# Patient Record
Sex: Female | Born: 1995 | Marital: Married | Smoking: Never smoker
Health system: Southern US, Community
[De-identification: ages and names within clinical notes are randomized; demographics above are authoritative.]

## PROBLEM LIST (undated history)

## (undated) DIAGNOSIS — O039 Complete or unspecified spontaneous abortion without complication: Secondary | ICD-10-CM

## (undated) HISTORY — DX: Complete or unspecified spontaneous abortion without complication: O03.9

---

## 2021-04-04 ENCOUNTER — Ambulatory Visit (INDEPENDENT_AMBULATORY_CARE_PROVIDER_SITE_OTHER): Payer: BLUE CROSS/BLUE SHIELD | Admitting: Certified Nurse Midwife

## 2021-04-04 ENCOUNTER — Encounter: Payer: Self-pay | Admitting: Certified Nurse Midwife

## 2021-04-04 ENCOUNTER — Other Ambulatory Visit: Payer: Self-pay

## 2021-04-04 ENCOUNTER — Ambulatory Visit: Payer: BLUE CROSS/BLUE SHIELD

## 2021-04-04 VITALS — BP 108/72 | HR 76 | Resp 16 | Ht 60.0 in | Wt 184.7 lb

## 2021-04-04 DIAGNOSIS — O039 Complete or unspecified spontaneous abortion without complication: Secondary | ICD-10-CM

## 2021-04-04 NOTE — Progress Notes (Signed)
Subjective:    Crystal Stanton is a 26 y.o. female. Crystal Stanton reports bleeding. She was seen at [facility] and diagnosis with miscarriage 03/22/21, 03/30/21.  She is in acute distress. Ectopic risks: none. She notes that she had heavy bleeding with passing of clots and tissue on the first day. She continues to have moderate bleeding. She states the [facility] told her that she u/s showed that she still had products of conception present.   The following portions of the patient's history were reviewed and updated as appropriate: allergies, current medications, past family history, past medical history, past social history, past surgical history and problem list.  Review of Systems Pertinent items are noted in HPI.   Objective:     BP 108/72   Pulse 76   Resp 16   Ht 5' (1.524 m)   Wt 184 lb 11.2 oz (83.8 kg)   BMI 36.07 kg/m  General:   alert, cooperative, appears stated age and mild distress  Heart: regular rate and rhythm  Abdomen: soft, non-tender, without masses or organomegaly   Imaging Limited office ultrasound: repeat u/s ordered   ED note [facility] 03/30/21 Ultrasound today shows IUP at 6 weeks 2 days with no fetal heart tones. Ultrasound 8 days ago showed IUP at 6 weeks 0 days without fetal heart tones. Ultrasound today also shows possible small subchorionic hemorrhage.   Previous beta quant levels  03/22/21 9478.2 3/31/2207755.0 04/01/21  2269.8     Assessment:      Bleeding in early pregnancy   Plan:    Blood type and Rh: positive.  Discussed repeat u/s to evaluate for retained products. Use of medication if products still present. Repeat beta quant level today and continued monitoring until returns to non pregnant level. She verbalizes and agrees to plan of care. Will follow up with results    Face to face time 15 min. Reviewing history , developing plan of care.   Doreene Burke, CNM

## 2021-04-05 ENCOUNTER — Telehealth: Payer: Self-pay | Admitting: Certified Nurse Midwife

## 2021-04-05 ENCOUNTER — Other Ambulatory Visit: Payer: Self-pay | Admitting: Certified Nurse Midwife

## 2021-04-05 ENCOUNTER — Other Ambulatory Visit: Payer: Self-pay

## 2021-04-05 ENCOUNTER — Ambulatory Visit
Admission: RE | Admit: 2021-04-05 | Discharge: 2021-04-05 | Disposition: A | Discharge status: Home / Self Care | Payer: BLUE CROSS/BLUE SHIELD | Source: Ambulatory Visit | Attending: Certified Nurse Midwife | Admitting: Certified Nurse Midwife

## 2021-04-05 DIAGNOSIS — O039 Complete or unspecified spontaneous abortion without complication: Secondary | ICD-10-CM | POA: Insufficient documentation

## 2021-04-05 LAB — BETA HCG QUANT (REF LAB): hCG Quant: 873 m[IU]/mL

## 2021-04-05 MED ORDER — MISOPROSTOL 200 MCG PO TABS: 800.0000 ug | ORAL_TABLET | Freq: Once | ORAL | 1 refills | Status: DC

## 2021-04-05 MED ORDER — OXYCODONE-ACETAMINOPHEN 7.5-325 MG PO TABS: 1.0000 | ORAL_TABLET | Freq: Four times a day (QID) | ORAL | 0 refills | Status: DC | PRN

## 2021-04-05 NOTE — Telephone Encounter (Signed)
Patient called in requesting lab results.   Please advise

## 2021-04-05 NOTE — Telephone Encounter (Signed)
Called and spoke with pt regarding u/s results. Discussed retained products and use of medication or surgery for removal. She request use of medication. Discussed cytotec , dose , route , and instructions for use. May repeat dose 48 hrs if tissue not passed. Red flag symptoms.  Pt will return to the office on Friday for repeat beta quant level.   Crystal Stanton, CNM   FACTS YOU SHOULD KNOW  About Early Pregnancy Loss  WHAT IS AN EARLY PREGNANCY LOSS? Once the egg is fertilized with the sperm and begins to develop, it attaches to the lining of the uterus. This early pregnancy tissue may not develop into an embryo (the beginning stage of a baby). Sometimes an embryo does develop but does not continue to grow. These problems can be seen on ultrasound.   MANAGEMNT OF EARLY PREGNANCY LOSS: About 4 out of 100 (0.25%) women will have a pregnancy loss in her lifetime.  One in five pregnancies is found to be an early pregnancy loss.  There are 3 ways to care for an early pregnancy loss:   (1) Surgery, (2) Medicine, (3) Waiting for you to pass the pregnancy on your own. The decision as to how to proceed after being diagnosed with and early pregnancy loss is an individual one.  The decision can be made only after appropriate counseling.  You need to weigh the pros and cons of the 3 choices. Then you can make the choice that works for you.  SURGERY (D&E) . Procedure over in 1 day . Requires being put to sleep . Bleeding may be light . Possible problems during surgery, including injury to womb(uterus) . Care provider has more control Medicine (CYTOTEC) . The complete procedure may take days to weeks . No Surgery . Bleeding may be heavy at times . There may be drug side effects . Patient has more control Waiting . You may choose to wait, in which case your own body may complete the passing of the abnormal early pregnancy on its own in about 2-4 weeks . Your bleeding may be heavy at times . There is a  small possibility that you may need surgery if the bleeding is too much or not all of the pregnancy has passed.  CYTOTEC MANAGEMENT Prostaglandins (cytotec) are the most widely used drug for this purpose. They cause the uterus to cramp and contract. You will place the medicine yourself inside your vagina in the privacy of your home. Empting of the uterus should occur within 3 days but the process may continue for several weeks. The bleeding may seem heavy at times.  INSTRUCTIONS: Take all 4 tablets of cytotec ( total) at one time. This will cause a lot of cramping, you may have bleeding, and pass tissue, then the cramping and bleeding should get better. If you do not pass the tissue, then you can take 4 more tablets of cytotec ( total) 48 hours after your first dose.  You will come back to have your blood drawn to make sure the pregnancy hormones are dropping in 1 week. Please call us if you have any questions.   POSSIBLE SIDE EFFECTS FROM CYTOTEC . Nausea  Vomiting . Diarrhea Fever . Chills  Hot Flashes Side effects  from the process of the early pregnancy loss include: . Cramping  Bleeding . Headaches  Dizziness RISKS: This is a low risk procedure. Less than 1 in 100 women has a complication. An incomplete passage of the early pregnancy may occur. Also, hemorrhage (  heavy bleeding) could happen.  Rarely the pregnancy will not be passed completely. Excessively heavy bleeding may occur.  Your doctor may need to perform surgery to empty the uterus (D&E). Afterwards: Everybody will feel differently after the early pregnancy loss completion. You may have soreness or cramps for a day or two. You may have soreness or cramps for day or two.  You may have light bleeding for up to 2 weeks. You may be as active as you feel like being. If you have any of the following problems you may call [facility] at [phone] or Maternity Admissions Unit at [phone] if it is after hours. . If you have  pain that does not get better with pain medication . Bleeding that soaks through 2 thick full-sized sanitary pads in an hour . Cramps that last longer than 2 days . Foul smelling discharge . Fever above 100.4 degrees F Even if you do not have any of these symptoms, you should have a follow-up exam to make sure you are healing properly. Your next normal period will usually start again in 4-6 week after the loss. You can get pregnant soon after the loss, so use birth control right away. Finally: Make sure all your questions are answered before during and after any procedure. Follow up with medical care and family planning methods.

## 2021-04-07 ENCOUNTER — Other Ambulatory Visit: Payer: Self-pay

## 2021-04-07 ENCOUNTER — Other Ambulatory Visit: Payer: BLUE CROSS/BLUE SHIELD

## 2021-04-07 DIAGNOSIS — O039 Complete or unspecified spontaneous abortion without complication: Secondary | ICD-10-CM

## 2021-04-08 ENCOUNTER — Other Ambulatory Visit: Payer: Self-pay | Admitting: Certified Nurse Midwife

## 2021-04-08 DIAGNOSIS — O039 Complete or unspecified spontaneous abortion without complication: Secondary | ICD-10-CM

## 2021-04-08 LAB — BETA HCG QUANT (REF LAB): hCG Quant: 270 m[IU]/mL

## 2021-04-12 ENCOUNTER — Encounter: Payer: Self-pay | Admitting: Certified Nurse Midwife

## 2021-04-13 ENCOUNTER — Other Ambulatory Visit: Payer: Self-pay

## 2021-04-13 ENCOUNTER — Other Ambulatory Visit: Payer: Self-pay | Admitting: Certified Nurse Midwife

## 2021-04-13 ENCOUNTER — Other Ambulatory Visit: Payer: BLUE CROSS/BLUE SHIELD

## 2021-04-13 DIAGNOSIS — O039 Complete or unspecified spontaneous abortion without complication: Secondary | ICD-10-CM

## 2021-04-14 LAB — BETA HCG QUANT (REF LAB): hCG Quant: 28 m[IU]/mL

## 2021-04-16 ENCOUNTER — Other Ambulatory Visit: Payer: Self-pay | Admitting: Certified Nurse Midwife

## 2021-04-16 DIAGNOSIS — O039 Complete or unspecified spontaneous abortion without complication: Secondary | ICD-10-CM

## 2021-04-19 ENCOUNTER — Other Ambulatory Visit: Payer: Self-pay

## 2021-04-19 ENCOUNTER — Other Ambulatory Visit: Payer: BLUE CROSS/BLUE SHIELD

## 2021-04-19 DIAGNOSIS — O039 Complete or unspecified spontaneous abortion without complication: Secondary | ICD-10-CM

## 2021-04-20 LAB — BETA HCG QUANT (REF LAB): hCG Quant: 6 m[IU]/mL

## 2021-04-21 ENCOUNTER — Other Ambulatory Visit: Payer: Self-pay | Admitting: Certified Nurse Midwife

## 2021-04-21 DIAGNOSIS — O039 Complete or unspecified spontaneous abortion without complication: Secondary | ICD-10-CM

## 2021-04-25 ENCOUNTER — Other Ambulatory Visit: Payer: BLUE CROSS/BLUE SHIELD

## 2021-04-25 ENCOUNTER — Other Ambulatory Visit: Payer: Self-pay

## 2021-04-25 DIAGNOSIS — O039 Complete or unspecified spontaneous abortion without complication: Secondary | ICD-10-CM

## 2021-04-26 LAB — BETA HCG QUANT (REF LAB): hCG Quant: 2 m[IU]/mL

## 2021-05-26 ENCOUNTER — Encounter: Payer: Self-pay | Admitting: Certified Nurse Midwife

## 2021-05-26 ENCOUNTER — Other Ambulatory Visit (HOSPITAL_COMMUNITY)
Admission: RE | Admit: 2021-05-26 | Discharge: 2021-05-26 | Disposition: A | Discharge status: Home / Self Care | Payer: BLUE CROSS/BLUE SHIELD | Source: Ambulatory Visit | Attending: Certified Nurse Midwife | Admitting: Certified Nurse Midwife

## 2021-05-26 ENCOUNTER — Ambulatory Visit: Payer: BLUE CROSS/BLUE SHIELD | Admitting: Certified Nurse Midwife

## 2021-05-26 ENCOUNTER — Other Ambulatory Visit: Payer: Self-pay

## 2021-05-26 VITALS — BP 113/78 | HR 66 | Resp 16 | Ht 60.0 in | Wt 182.3 lb

## 2021-05-26 DIAGNOSIS — Z124 Encounter for screening for malignant neoplasm of cervix: Secondary | ICD-10-CM | POA: Insufficient documentation

## 2021-05-26 DIAGNOSIS — Z8759 Personal history of other complications of pregnancy, childbirth and the puerperium: Secondary | ICD-10-CM

## 2021-05-26 DIAGNOSIS — N924 Excessive bleeding in the premenopausal period: Secondary | ICD-10-CM | POA: Insufficient documentation

## 2021-05-26 DIAGNOSIS — Z8742 Personal history of other diseases of the female genital tract: Secondary | ICD-10-CM

## 2021-05-26 DIAGNOSIS — N923 Ovulation bleeding: Secondary | ICD-10-CM | POA: Diagnosis present

## 2021-05-26 DIAGNOSIS — N930 Postcoital and contact bleeding: Secondary | ICD-10-CM | POA: Diagnosis present

## 2021-05-26 MED ORDER — METFORMIN HCL 500 MG PO TABS: ORAL_TABLET | ORAL | 5 refills | Status: AC

## 2021-05-26 NOTE — Progress Notes (Signed)
GYN ENCOUNTER NOTE  Subjective:       Crystal Stanton is a 26 y.o. G1P0 female is here for gynecologic evaluation of the following issues:  1. Daily spotting since miscarriage 2. Bleeding after sex  History of [place] (03/[place]21[place].   Desires pregnancy, history significant for PCOS.   Denies difficulty breathing or respiratory distress, chest pain, excessive vaginal bleeding, dysuria, and leg pain or swelling.    Gynecologic History  Patient's last menstrual period was 05/09/2021.  Contraception: none  Last Pap: 2020. Results were: normal per patient  Obstetric History  OB History  Gravida Para Term Preterm AB Living  1       1    SAB IAB Ectopic Multiple Live Births  1            # Outcome Date GA Lbr Len/2nd Weight Sex Delivery Anes PTL Lv  1 SAB 03/2021            History reviewed. No pertinent past medical history.  History reviewed. No pertinent surgical history.   Not on File  Social History   Socioeconomic History  . Marital status: Married    Spouse name: Not on file  . Number of children: Not on file  . Years of education: Not on file  . Highest education level: Not on file  Occupational History  . Not on file  Tobacco Use  . Smoking status: Never Smoker  . Smokeless tobacco: Never Used  Substance and Sexual Activity  . Alcohol use: Never  . Drug use: Never  . Sexual activity: Yes    Partners: Male  Other Topics Concern  . Not on file  Social History Narrative  . Not on file   Social Determinants of Health   Financial Resource Strain: Not on file  Food Insecurity: Not on file  Transportation Needs: Not on file  Physical Activity: Not on file  Stress: Not on file  Social Connections: Not on file  Intimate Partner Violence: Not on file    Family History  Problem Relation Age of Onset  . Diabetes Mother   . Diabetes Maternal Grandmother   . Gastric cancer Maternal Grandfather     The following portions of the  patient's history were reviewed and updated as appropriate: allergies, current medications, past family history, past medical history, past social history, past surgical history and problem list.  Review of Systems  ROS negative except as noted above. Information obtained from patient.   Objective:   BP 113/78   Pulse 66   Resp 16   Ht 5' (1.524 m)   Wt 182 lb 4.8 oz (82.7 kg)   LMP 05/09/2021   BMI 35.60 kg/m  CONSTITUTIONAL: Well-developed, well-nourished female in no acute distress.   PELVIC:  External Genitalia: Normal  Vagina: Normal, scant bleeding present  Cervix: Normal, Pap collected   MUSCULOSKELETAL: Normal range of motion. No tenderness.  No cyanosis, clubbing, or edema.  Assessment:   1. Intermenstrual spotting  - Progesterone - Estradiol - FSH/LH - TSH + free T4 - Cytology - PAP  2. History of miscarriage  - Progesterone - Estradiol - FSH/LH - TSH + free T4  3. Premenopausal postcoital bleeding  - Progesterone - Estradiol - FSH/LH - TSH + free T4 - Cytology - PAP  4. Screening for cervical cancer  - Cytology - PAP  5. History of PCOS      Plan:   Labs today, see orders.   Rx Metformin,  see orders.   Will discuss next step via MyChart.   Reviewed red flag symptoms and when to call.   RTC as indicated.    Serafina Royals, CNM [facility], [facility]

## 2021-05-26 NOTE — Patient Instructions (Signed)
Metformin Tablets What is this medicine? METFORMIN (met FOR min) is used to treat type 2 diabetes. It helps to control blood sugar. Treatment is combined with diet and exercise. This medicine can be used alone or with other medicines for diabetes. This medicine may be used for other purposes; ask your health care provider or pharmacist if you have questions. COMMON BRAND NAME(S): Glucophage What should I tell my health care provider before I take this medicine? They need to know if you have any of these conditions:  anemia  dehydration  heart disease  frequently drink alcohol-containing beverages  kidney disease  liver disease  polycystic ovary syndrome  serious infection or injury  vomiting  an unusual or allergic reaction to metformin, other medicines, foods, dyes, or preservatives  pregnant or trying to get pregnant  breast-feeding How should I use this medicine? Take this medicine by mouth with a glass of water. Follow the directions on the prescription label. Take this medicine with food. Take your medicine at regular intervals. Do not take your medicine more often than directed. Do not stop taking except on your doctor's advice. Talk to your pediatrician regarding the use of this medicine in children. While this drug may be prescribed for children as young as 17 years of age for selected conditions, precautions do apply. Overdosage: If you think you have taken too much of this medicine contact a poison control center or emergency room at once. NOTE: This medicine is only for you. Do not share this medicine with others. What if I miss a dose? If you miss a dose, take it as soon as you can. If it is almost time for your next dose, take only that dose. Do not take double or extra doses. What may interact with this medicine? Do not take this medicine with any of the following medications:  certain contrast medicines given before X-rays, CT scans, MRI, or other  procedures  dofetilide This medicine may also interact with the following medications:  acetazolamide  alcohol  certain antivirals for HIV or hepatitis  certain medicines for blood pressure, heart disease, irregular heart beat  cimetidine  dichlorphenamide  digoxin  diuretics  female hormones, like estrogens or progestins and birth control pills  glycopyrrolate  isoniazid  lamotrigine  memantine  methazolamide  metoclopramide  midodrine  niacin  phenothiazines like chlorpromazine, mesoridazine, prochlorperazine, thioridazine  phenytoin  ranolazine  steroid medicines like prednisone or cortisone  stimulant medicines for attention disorders, weight loss, or to stay awake  thyroid medicines  topiramate  trospium  vandetanib  zonisamide This list may not describe all possible interactions. Give your health care provider a list of all the medicines, herbs, non-prescription drugs, or dietary supplements you use. Also tell them if you smoke, drink alcohol, or use illegal drugs. Some items may interact with your medicine. What should I watch for while using this medicine? Visit your doctor or health care professional for regular checks on your progress. A test called the HbA1C (A1C) will be monitored. This is a simple blood test. It measures your blood sugar control over the last 2 to 3 months. You will receive this test every 3 to 6 months. Learn how to check your blood sugar. Learn the symptoms of low and high blood sugar and how to manage them. Always carry a quick-source of sugar with you in case you have symptoms of low blood sugar. Examples include hard sugar candy or glucose tablets. Make sure others know that you can choke  if you eat or drink when you develop serious symptoms of low blood sugar, such as seizures or unconsciousness. They must get medical help at once. Tell your doctor or health care professional if you have high blood sugar. You might  need to change the dose of your medicine. If you are sick or exercising more than usual, you might need to change the dose of your medicine. Do not skip meals. Ask your doctor or health care professional if you should avoid alcohol. Many nonprescription cough and cold products contain sugar or alcohol. These can affect blood sugar. This medicine may cause ovulation in premenopausal women who do not have regular monthly periods. This may increase your chances of becoming pregnant. You should not take this medicine if you become pregnant or think you may be pregnant. Talk with your doctor or health care professional about your birth control options while taking this medicine. Contact your doctor or health care professional right away if you think you are pregnant. If you are going to need surgery, a MRI, CT scan, or other procedure, tell your doctor that you are taking this medicine. You may need to stop taking this medicine before the procedure. Wear a medical ID bracelet or chain, and carry a card that describes your disease and details of your medicine and dosage times. This medicine may cause a decrease in folic acid and vitamin B12. You should make sure that you get enough vitamins while you are taking this medicine. Discuss the foods you eat and the vitamins you take with your health care professional. What side effects may I notice from receiving this medicine? Side effects that you should report to your doctor or health care professional as soon as possible:  allergic reactions like skin rash, itching or hives, swelling of the face, lips, or tongue  breathing problems  feeling faint or lightheaded, falls  muscle aches or pains  signs and symptoms of low blood sugar such as feeling anxious, confusion, dizziness, increased hunger, unusually weak or tired, sweating, shakiness, cold, irritable, headache, blurred vision, fast heartbeat, loss of consciousness  slow or irregular  heartbeat  unusual stomach pain or discomfort  unusually tired or weak Side effects that usually do not require medical attention (report to your doctor or health care professional if they continue or are bothersome):  diarrhea  headache  heartburn  metallic taste in mouth  nausea  stomach gas, upset This list may not describe all possible side effects. Call your doctor for medical advice about side effects. You may report side effects to FDA at 1-800-FDA-1088. Where should I keep my medicine? Keep out of the reach of children. Store at room temperature between 15 and 30 degrees C (59 and 86 degrees F). Protect from moisture and light. Throw away any unused medicine after the expiration date. NOTE: This sheet is a summary. It may not cover all possible information. If you have questions about this medicine, talk to your doctor, pharmacist, or health care provider.  2021 Elsevier/Gold Standard (2020-11-13 10:29:07)   Polycystic Ovary Syndrome  Polycystic ovarian syndrome (PCOS) is a common hormonal disorder among women of reproductive age. In most women with PCOS, small fluid-filled sacs (cysts) grow on the ovaries. PCOS can cause problems with menstrual periods and make it hard to get and stay pregnant. If this condition is not treated, it can lead to serious health problems, such as diabetes and heart disease. What are the causes? The cause of this condition is not known.  It may be due to certain factors, such as:  Irregular menstrual cycle.  High levels of certain hormones.  Problems with the hormone that helps to control blood sugar (insulin).  Certain genes. What increases the risk? You are more likely to develop this condition if you:  Have a family history of PCOS or type 2 diabetes.  Are overweight, eat unhealthy foods, and are not active. These factors may cause problems with blood sugar control, which can contribute to PCOS or PCOS symptoms. What are the signs  or symptoms? Symptoms of this condition include:  Ovarian cysts and sometimes pelvic pain.  Menstrual periods that are not regular or are too heavy.  Inability to get or stay pregnant.  Increased growth of hair on the face, chest, stomach, back, thumbs, thighs, or toes.  Acne or oily skin. Acne may develop during adulthood, and it may not get better with treatment.  Weight gain or obesity.  Patches of thickened and dark brown or black skin on the neck, arms, breasts, or thighs. How is this diagnosed? This condition is diagnosed based on:  Your medical history.  A physical exam that includes a pelvic exam. Your health care provider may look for areas of increased hair growth on your skin.  Tests, such as: ? An ultrasound to check the ovaries for cysts and to view the lining of the uterus. ? Blood tests to check levels of sugar (glucose), female hormone (testosterone), and female hormones (estrogen and progesterone). How is this treated? There is no cure for this condition, but treatment can help to manage symptoms and prevent more health problems from developing. Treatment varies depending on your symptoms and if you want to have a baby or if you need birth control. Treatment may include:  Making nutrition and lifestyle changes.  Taking the progesterone hormone to start a menstrual period.  Taking birth control pills to help you have regular menstrual periods.  Taking medicines such as: ? Medicines to make you ovulate, if you want to get pregnant. ? Medicine to reduce extra hair growth.  Having surgery in severe cases. This may involve making small holes in one or both of your ovaries. This decreases the amount of testosterone that your body makes. Follow these instructions at home:  Take over-the-counter and prescription medicines only as told by your health care provider.  Follow a healthy meal plan that includes lean proteins, complex carbohydrates, fresh fruits and  vegetables, low-fat dairy products, healthy fats, and fiber.  If you are overweight, lose weight as told by your health care provider. Your health care provider can determine how much weight loss is best for you and can help you lose weight safely.  Keep all follow-up visits. This is important. Contact a health care provider if:  Your symptoms do not get better with medicine.  Your symptoms get worse or you develop new symptoms. Summary  Polycystic ovarian syndrome (PCOS) is a common hormonal disorder among women of reproductive age.  PCOS can cause problems with menstrual periods and make it hard to get and stay pregnant.  If this condition is not treated, it can lead to serious health problems, such as diabetes and heart disease.  There is no cure for this condition, but treatment can help to manage symptoms and prevent more health problems from developing. This information is not intended to replace advice given to you by your health care provider. Make sure you discuss any questions you have with your health care provider. Document  Revised: 05/26/2020 Document Reviewed: 05/26/2020 Elsevier Patient Education  [place].   Diet for Polycystic Ovary Syndrome Polycystic ovary syndrome (PCOS) is a common hormonal disorder that affects a woman's reproductive system. It can cause problems with menstrual periods and make it hard to get and stay pregnant. Changing what you eat can help your hormones reach normal levels, improve your health, and help you better manage PCOS. Following a balanced diet can help you lose weight and improve the way that your body uses the hormone insulin to control blood sugar. This may include:  Eating low-fat (lean) proteins, complex carbohydrates, fresh fruits and vegetables, low-fat dairy products, healthy fats, and fiber.  Cutting down on calories.  Exercising regularly. What are tips for following this plan?  Follow a balanced diet for meals  and snacks. Eat breakfast, lunch, dinner, and one or two snacks every day.  Include protein in each meal and snack.  Choose whole grains instead of products that are made with refined flour.  Eat a variety of foods.  Exercise regularly as told by your health care provider. Aim to do at least 30 minutes of exercise on most days of the week.  If you are overweight or obese: ? Pay attention to how many calories you eat. Cutting down on calories can help you lose weight. ? Work with your health care provider or a dietitian to figure out how many calories you need each day. What foods should I eat? Fruits Include a variety of colors and types. All fruits are helpful for PCOS. Vegetables Include a variety of colors and types. All vegetables are helpful for PCOS. Grains Whole grains, such as whole wheat. Whole-grain breads, crackers, cereals, and pasta. Unsweetened oatmeal. Bulgur, barley, quinoa, and brown rice. Tortillas made from corn or whole-wheat flour. Meats and other proteins Lean proteins, such as fish, chicken, beans, eggs, and tofu. Dairy Low-fat dairy products, such as skim milk, cheese sticks, and yogurt. Beverages Low-fat or fat-free drinks, such as water, low-fat milk, sugar-free drinks, and small amounts of 100% fruit juice. Seasonings and condiments Ketchup. Mustard. Barbecue sauce. Relish. Low-fat or fat-free mayonnaise. Fats and oils Olive oil or canola oil. Walnuts and almonds. The items listed above may not be a complete list of recommended foods and beverages. Contact a dietitian for more options.   What foods should I avoid? Foods that are high in calories or fat, especially saturated or trans fats. Fried foods. Sweets. Products that are made from refined white flour, including white bread, pastries, white rice, and pasta. The items listed above may not be a complete list of foods and beverages to avoid. Contact a dietitian for more information. Summary  PCOS is a  hormonal imbalance that affects a woman's reproductive system. It can cause problems with menstrual periods and make it hard to get and stay pregnant.  You can help to manage your PCOS by exercising regularly and eating a healthy, varied diet of vegetables, fruit, whole grains, lean protein, and low-fat dairy products.  Changing what you eat can improve the way that your body uses insulin, help your hormones reach normal levels, and help you lose weight. This information is not intended to replace advice given to you by your health care provider. Make sure you discuss any questions you have with your health care provider. Document Revised: 05/26/2020 Document Reviewed: 05/26/2020 Elsevier Patient Education  2021 [employer].

## 2021-05-27 LAB — FSH/LH
FSH: 4.8 m[IU]/mL
LH: 14.6 m[IU]/mL

## 2021-05-27 LAB — PROGESTERONE: Progesterone: 0.7 ng/mL

## 2021-05-27 LAB — TSH+FREE T4
Free T4: 1.01 ng/dL (ref 0.82–1.77)
TSH: 3.12 u[IU]/mL (ref 0.450–4.500)

## 2021-05-27 LAB — ESTRADIOL: Estradiol: 54.9 pg/mL

## 2021-06-02 LAB — CYTOLOGY - PAP: Diagnosis: NEGATIVE

## 2021-07-26 ENCOUNTER — Other Ambulatory Visit: Payer: Self-pay

## 2021-07-26 ENCOUNTER — Ambulatory Visit (INDEPENDENT_AMBULATORY_CARE_PROVIDER_SITE_OTHER): Payer: Self-pay | Admitting: Certified Nurse Midwife

## 2021-07-26 ENCOUNTER — Other Ambulatory Visit (INDEPENDENT_AMBULATORY_CARE_PROVIDER_SITE_OTHER): Payer: Self-pay | Admitting: Certified Nurse Midwife

## 2021-07-26 ENCOUNTER — Encounter: Payer: Self-pay | Admitting: Certified Nurse Midwife

## 2021-07-26 DIAGNOSIS — N939 Abnormal uterine and vaginal bleeding, unspecified: Secondary | ICD-10-CM

## 2021-07-26 NOTE — Progress Notes (Signed)
OB/GYN CONFERENCE NOTE:  Subjective:       Crystal Stanton is a 26 y.o. G14P0010 female who presents for a conference appointment. Current complaints include: continued bleeding status post miscarrage in April. Pt state that she was seen in at [facility] and had u/s . She was instructed to follow up with her GYN.     Gynecologic History Patient's last menstrual period was 07/14/2021 (exact date). Contraception: none  Obstetric History OB History  Gravida Para Term Preterm AB Living  1       1    SAB IAB Ectopic Multiple Live Births  1            # Outcome Date GA Lbr Len/2nd Weight Sex Delivery Anes PTL Lv  1 SAB 03/2021            Past Medical History:  Diagnosis Date   Miscarriage     No past surgical history on file.  Current Outpatient Medications on File Prior to Visit  Medication Sig Dispense Refill   metFORMIN (GLUCOPHAGE) 500 MG tablet Take one tablet by mouth daily for one week. Then increase to one tablet twice a day for one week.  Then two tablets twice a day. 60 tablet 5   No current facility-administered medications on file prior to visit.    Not on File  Social History   Socioeconomic History   Marital status: Married    Spouse name: Not on file   Number of children: Not on file   Years of education: Not on file   Highest education level: Not on file  Occupational History   Not on file  Tobacco Use   Smoking status: Never   Smokeless tobacco: Never  Vaping Use   Vaping Use: Never used  Substance and Sexual Activity   Alcohol use: Never   Drug use: Never   Sexual activity: Yes    Partners: Male  Other Topics Concern   Not on file  Social History Narrative   Not on file   Social Determinants of Health   Financial Resource Strain: Not on file  Food Insecurity: Not on file  Transportation Needs: Not on file  Physical Activity: Not on file  Stress: Not on file  Social Connections: Not on file  Intimate Partner Violence: Not on file    Family  History  Problem Relation Age of Onset   Diabetes Mother    Diabetes Maternal Grandmother    Gastric cancer Maternal Grandfather     The following portions of the patient's history were reviewed and updated as appropriate: allergies, current medications, past family history, past medical history, past social history, past surgical history and problem list.  Review of Systems Pertinent items are noted in HPI.   Objective:   LMP 07/14/2021 (Exact Date)    Assessment/Plan:   Patient Active Problem List   Diagnosis Date Noted   Miscarriage 04/05/2021   -No evidence of ovarian torsion. No evidence of retained products of conception.   -There are 2 areas of prominent myometrial blood flow. One area is in the anterior uterine fundus region the other is in the posterior lower uterine segment. No definitive evidence of color aliasing or small anechoic spaces in the myometrium. However, given history of instrumentation, consider nonemergent MRA for evaluation of possible uterine AVM.   -Enlarged ovaries with multiple follicles, which can be seen and PCOS. Recommend correlation with clinical exam.   -Small-volume free fluid is seen the pelvis.   -Right adnexal  hydrosalpinx. ====================     Spectral doppler was performed on bilateral ovaries.  Additional transabdominal images were not obtained.  3D reconstruction images were not obtained.   Please see below for data measurements:    LMP: 05/31/2021   Uterus: Sagittal 6.6 cm; AP 3.0 cm; Transverse 4.7 cm  Endometrium: 1.18 cm  Right ovary: Sagittal 5.5 cm; AP 2.9 cm; Transverse 3.4 cm  Left ovary: Sagittal 4.6 cm; AP 2.7 cm; Transverse 2.6 cm    Dr.Evans consulted on pt u/s results. Discussed completing recommended additional studies. Radiologist at [facility] consulted. Recommend CTA angiogram of abdomen & pelvis with IV contrast. Orders placed. Pt notified of recommendations. Follow up with results    Time: 20  Return to  Clinic: Will discuss with pt once images completed and reviewed.    Doreene Burke, CNM [facility]

## 2021-08-01 NOTE — Telephone Encounter (Signed)
Pt aware CT is on 8/09

## 2021-08-08 ENCOUNTER — Ambulatory Visit: Admission: RE | Admit: 2021-08-08 | Payer: Self-pay | Source: Ambulatory Visit

## 2021-08-17 ENCOUNTER — Other Ambulatory Visit: Payer: Self-pay

## 2021-08-17 ENCOUNTER — Ambulatory Visit
Admission: RE | Admit: 2021-08-17 | Discharge: 2021-08-17 | Disposition: A | Discharge status: Home / Self Care | Payer: Self-pay | Source: Ambulatory Visit | Attending: Certified Nurse Midwife | Admitting: Certified Nurse Midwife

## 2021-08-17 DIAGNOSIS — N939 Abnormal uterine and vaginal bleeding, unspecified: Secondary | ICD-10-CM | POA: Insufficient documentation

## 2021-08-17 MED ORDER — IOHEXOL 350 MG/ML SOLN
100.0000 mL | Freq: Once | INTRAVENOUS | Status: AC | PRN
  Administered 2021-08-17: 100 mL via INTRAVENOUS

## 2021-08-22 ENCOUNTER — Other Ambulatory Visit: Payer: Self-pay | Admitting: Certified Nurse Midwife

## 2021-08-28 ENCOUNTER — Other Ambulatory Visit: Payer: Self-pay | Admitting: Certified Nurse Midwife

## 2021-08-28 MED ORDER — MEDROXYPROGESTERONE ACETATE 10 MG PO TABS: 10.0000 mg | ORAL_TABLET | Freq: Every day | ORAL | 0 refills | Status: AC

## 2022-12-23 ENCOUNTER — Ambulatory Visit
Admission: EM | Admit: 2022-12-23 | Discharge: 2022-12-23 | Disposition: A | Discharge status: Home / Self Care | Payer: BLUE CROSS/BLUE SHIELD | Attending: Emergency Medicine | Admitting: Emergency Medicine

## 2022-12-23 DIAGNOSIS — N3001 Acute cystitis with hematuria: Secondary | ICD-10-CM | POA: Insufficient documentation

## 2022-12-23 LAB — URINALYSIS, MICROSCOPIC (REFLEX)

## 2022-12-23 LAB — URINALYSIS, ROUTINE W REFLEX MICROSCOPIC
Bilirubin Urine: NEGATIVE
Glucose, UA: NEGATIVE mg/dL
Nitrite: NEGATIVE
Specific Gravity, Urine: >1.03 — ABNORMAL HIGH (ref 1.005–1.030)
pH: 5.5 (ref 5.0–8.0)

## 2022-12-23 MED ORDER — NITROFURANTOIN MONOHYD MACRO 100 MG PO CAPS: 100.0000 mg | ORAL_CAPSULE | Freq: Two times a day (BID) | ORAL | 0 refills | Status: AC

## 2022-12-23 MED ORDER — PHENAZOPYRIDINE HCL 200 MG PO TABS: 200.0000 mg | ORAL_TABLET | Freq: Three times a day (TID) | ORAL | 0 refills | Status: AC

## 2022-12-23 NOTE — Discharge Instructions (Addendum)
You were seen for painful urination and are being treated for urinary tract infection.   - We are sending your urine out for a culture. If we need to add or change any medications, our nurse will give you a call to let you know. - Take the antibiotics as prescribed until they're finished. If you think you're having a reaction, stop the medication, take benadryl and go to the nearest urgent care/emergency room. Take a probiotic while taking the antibiotic to decrease the chances of stomach upset.  -Increase your hydration while on this antibiotic.  Take care, Dr. Sharlet Salina, NP-c

## 2022-12-23 NOTE — ED Triage Notes (Signed)
Pt c/o blood in urine x3days, Urinary frequency with blood clots.  Pt is unsure if it is a UTI or a kidney stone. Pt states that she has a history of UTIs  Pt states that she finished her period 2 days ago.

## 2022-12-23 NOTE — ED Provider Notes (Signed)
[facility] [place]   Name: Crystal Stanton DOB: [birth year] MRN: [id number] CSN: [id number] PCP: Pricilla Holm, PA-C  Arrival date and time:  12/23/22 0847  Chief Complaint:  Urinary Tract Infection   NOTE: Prior to seeing the patient today, I have reviewed the triage nursing documentation and vital signs. Clinical staff has updated patient's PMH/PSHx, current medication list, and drug allergies/intolerances to ensure comprehensive history available to assist in medical decision making.   History:   HPI: Crystal Stanton is a 27 y.o. female who presents today with complaints of painful urination pressure.  Patient states the symptoms started approximately 3 days ago.  Of note, she was recently treated with cephalexin by her primary care provider for similar issue approximately 3 weeks ago.  She states after completing her antibiotic given to her by her primary, her symptoms did resolve, returned 72 hours ago.  She denies any fevers, chills or body aches.  No back or abdominal pain.   Past Medical History:  Diagnosis Date   Miscarriage     History reviewed. No pertinent surgical history.  Family History  Problem Relation Age of Onset   Diabetes Mother    Diabetes Maternal Grandmother    Gastric cancer Maternal Grandfather     Social History   Tobacco Use   Smoking status: Never   Smokeless tobacco: Never  Vaping Use   Vaping Use: Never used  Substance Use Topics   Alcohol use: Never   Drug use: Never    Patient Active Problem List   Diagnosis Date Noted   Abnormal uterine bleeding 07/26/2021   Miscarriage 04/05/2021    Home Medications:    Current Meds  Medication Sig   metFORMIN (GLUCOPHAGE) 500 MG tablet Take one tablet by mouth daily for one week. Then increase to one tablet twice a day for one week.  Then two tablets twice a day.   norgestimate-ethinyl estradiol (ORTHO-CYCLEN) 0.25-35 MG-MCG tablet Take 1 tablet by mouth daily.    Allergies:    Patient has no known allergies.  Review of Systems (ROS): Review of Systems  Constitutional:  Negative for chills, fatigue and fever.  Gastrointestinal:  Negative for abdominal pain, diarrhea, nausea and vomiting.  Genitourinary:  Positive for difficulty urinating, dysuria and frequency. Negative for flank pain and pelvic pain.  Skin:  Negative for color change.  All other systems reviewed and are negative.    Vital Signs: Today's Vitals   12/23/22 0942 12/23/22 0943 12/23/22 0945  BP:   107/72  Pulse:   68  Resp:   18  Temp:   98 F (36.7 C)  TempSrc:   Oral  SpO2:   98%  Weight:  165 lb (74.8 kg)   Height:  5' (1.524 m)   PainSc: 7       Physical Exam: Physical Exam Vitals and nursing note reviewed.  Constitutional:      Appearance: Normal appearance.  Cardiovascular:     Rate and Rhythm: Normal rate and regular rhythm.     Pulses: Normal pulses.  Pulmonary:     Effort: Pulmonary effort is normal.     Breath sounds: Normal breath sounds.  Abdominal:     Palpations: Abdomen is soft.     Tenderness: There is no abdominal tenderness. There is no right CVA tenderness or left CVA tenderness.     Comments: Suprapubic pressure not pain  Skin:    General: Skin is warm and dry.  Neurological:  Mental Status: She is alert.      Urgent Care Treatments / Results:   LABS: PLEASE NOTE: all labs that were ordered this encounter are listed, however only abnormal results are displayed. Labs Reviewed  URINALYSIS, ROUTINE W REFLEX MICROSCOPIC - Abnormal; Notable for the following components:      Result Value   APPearance HAZY (*)    Specific Gravity, Urine >1.030 (*)    Hgb urine dipstick LARGE (*)    Ketones, ur TRACE (*)    Protein, ur TRACE (*)    Leukocytes,Ua TRACE (*)    All other components within normal limits  URINALYSIS, MICROSCOPIC (REFLEX) - Abnormal; Notable for the following components:   Bacteria, UA FEW (*)    All other components within normal  limits  URINE CULTURE    EKG: -None  RADIOLOGY: No results found.  PROCEDURES: Procedures  MEDICATIONS RECEIVED THIS VISIT: Medications - No data to display  PERTINENT CLINICAL COURSE NOTES/UPDATES:   Initial Impression / Assessment and Plan / Urgent Care Course:  Pertinent labs & imaging results that were available during my care of the patient were personally reviewed by me and considered in my medical decision making (see lab/imaging section of note for values and interpretations).  Crystal Stanton is a 27 y.o. female who presents to [facility] today with complaints of painful urination, diagnosed with acute cystitis with hematuria, and treated as such with the medications below. NP and patient reviewed discharge instructions below during visit.   Patient is well appearing overall in clinic today. She does not appear to be in any acute distress. Presenting symptoms (see HPI) and exam as documented above.   I have reviewed the follow up and strict return precautions for any new or worsening symptoms. Patient is aware of symptoms that would be deemed urgent/emergent, and would thus require further evaluation either here or in the emergency department. At the time of discharge, she verbalized understanding and consent with the discharge plan as it was reviewed with her. All questions were fielded by provider and/or clinic staff prior to patient discharge.    Final Clinical Impressions / Urgent Care Diagnoses:   Final diagnoses:  Acute cystitis with hematuria    New Prescriptions:  [place] Controlled Substance Registry consulted? Not Applicable  Meds ordered this encounter  Medications   nitrofurantoin, macrocrystal-monohydrate, (MACROBID) 100 MG capsule    Sig: Take 1 capsule (100 mg total) by mouth 2 (two) times daily.    Dispense:  14 capsule    Refill:  0   phenazopyridine (PYRIDIUM) 200 MG tablet    Sig: Take 1 tablet (200 mg total) by mouth 3 (three) times daily.     Dispense:  6 tablet    Refill:  0      Discharge Instructions      You were seen for painful urination and are being treated for urinary tract infection.   - We are sending your urine out for a culture. If we need to add or change any medications, our nurse will give you a call to let you know. - Take the antibiotics as prescribed until they're finished. If you think you're having a reaction, stop the medication, take benadryl and go to the nearest urgent care/emergency room. Take a probiotic while taking the antibiotic to decrease the chances of stomach upset.  -Increase your hydration while on this antibiotic.  Take care, Dr. Sharlet Salina, NP-c     Recommended Follow up Care:  Patient encouraged to  follow up with the following provider within the specified time frame, or sooner as dictated by the severity of her symptoms. As always, she was instructed that for any urgent/emergent care needs, she should seek care either here or in the emergency department for more immediate evaluation.   Bailey Mech, DNP, NP-c   Bailey Mech, NP 12/23/22 1022

## 2022-12-25 LAB — URINE CULTURE: Culture: 50000 — AB

## 2023-01-03 ENCOUNTER — Ambulatory Visit
Admission: EM | Admit: 2023-01-03 | Discharge: 2023-01-03 | Disposition: A | Discharge status: Home / Self Care | Payer: BLUE CROSS/BLUE SHIELD | Attending: Family Medicine | Admitting: Family Medicine

## 2023-01-03 DIAGNOSIS — L259 Unspecified contact dermatitis, unspecified cause: Secondary | ICD-10-CM | POA: Diagnosis not present

## 2023-01-03 MED ORDER — FAMOTIDINE 20 MG PO TABS: 20.0000 mg | ORAL_TABLET | Freq: Two times a day (BID) | ORAL | 0 refills | Status: AC

## 2023-01-03 MED ORDER — TRIAMCINOLONE ACETONIDE 0.1 % EX OINT: 1.0000 | TOPICAL_OINTMENT | Freq: Four times a day (QID) | CUTANEOUS | 0 refills | Status: AC | PRN

## 2023-01-03 MED ORDER — CETIRIZINE HCL 10 MG PO TABS: 10.0000 mg | ORAL_TABLET | Freq: Two times a day (BID) | ORAL | 11 refills | Status: AC | PRN

## 2023-01-03 MED ORDER — PREDNISONE 10 MG (21) PO TBPK: ORAL_TABLET | Freq: Every day | ORAL | 0 refills | Status: AC

## 2023-01-03 NOTE — ED Provider Notes (Signed)
[facility]    CSN: [id number] Arrival date & time: 01/03/23  0955      History   Chief Complaint Chief Complaint  Patient presents with   Rash    HPI Crystal Stanton is a 28 y.o. female.   HPI  [name] presents for itchy rash started yesterday after showering.  The rash spread from the back of her thighs to her inguinal area and into her legs and arms.  The areas are red and she notes that there is some of them look like swelling.  No known bug bites.  Denies any new medications, detergents, lotions, foods or supplements.  There has been no shortness of breath, chest pain, nausea, vomiting, belly pain, palpitations or abdominal pain.     Past Medical History:  Diagnosis Date   Miscarriage     Patient Active Problem List   Diagnosis Date Noted   Abnormal uterine bleeding 07/26/2021   Miscarriage 04/05/2021    History reviewed. No pertinent surgical history.  OB History     Gravida  1   Para      Term      Preterm      AB  1   Living         SAB  1   IAB      Ectopic      Multiple      Live Births               Home Medications    Prior to Admission medications   Medication Sig Start Date End Date Taking? Authorizing Provider  cetirizine (ZYRTEC) 10 MG tablet Take 1 tablet (10 mg total) by mouth 2 (two) times daily as needed for allergies. 01/03/23  Yes Andera Cranmer, DO  famotidine (PEPCID) 20 MG tablet Take 1 tablet (20 mg total) by mouth 2 (two) times daily. 01/03/23  Yes Gigi Onstad, DO  metFORMIN (GLUCOPHAGE) 500 MG tablet Take one tablet by mouth daily for one week. Then increase to one tablet twice a day for one week.  Then two tablets twice a day. 05/26/21  Yes Lawhorn, Lara Mulch, CNM  predniSONE (STERAPRED UNI-PAK 21 TAB) 10 MG (21) TBPK tablet Take by mouth daily. Take 6 tabs by mouth daily for 1, then 5 tabs for 1 day, then 4 tabs for 1 day, then 3 tabs for 1 day, then 2 tabs for 1 day, then 1 tab for 1 day. 01/03/23  Yes  Marciel Offenberger, DO  triamcinolone ointment (KENALOG) 0.1 % Apply 1 Application topically 4 (four) times daily as needed. 01/03/23  Yes Crewe Heathman, DO  medroxyPROGESTERone (PROVERA) 10 MG tablet Take 1 tablet (10 mg total) by mouth daily for 10 days. 08/28/21 09/07/21  Philip Aspen, CNM  nitrofurantoin, macrocrystal-monohydrate, (MACROBID) 100 MG capsule Take 1 capsule (100 mg total) by mouth 2 (two) times daily. 12/23/22   Gertie Baron, NP  norgestimate-ethinyl estradiol (ORTHO-CYCLEN) 0.25-35 MG-MCG tablet Take 1 tablet by mouth daily. 07/18/22 07/18/23  [provider]  phenazopyridine (PYRIDIUM) 200 MG tablet Take 1 tablet (200 mg total) by mouth 3 (three) times daily. 12/23/22   Gertie Baron, NP    Family History Family History  Problem Relation Age of Onset   Diabetes Mother    Diabetes Maternal Grandmother    Gastric cancer Maternal Grandfather     Social History Social History   Tobacco Use   Smoking status: Never   Smokeless tobacco: Never  Vaping Use   Vaping  Use: Never used  Substance Use Topics   Alcohol use: Never   Drug use: Never     Allergies   Patient has no known allergies.   Review of Systems Review of Systems :negative unless otherwise stated in HPI.      Physical Exam Triage Vital Signs ED Triage Vitals  Enc Vitals Group     BP 01/03/23 1114 107/71     Pulse Rate 01/03/23 1114 68     Resp --      Temp 01/03/23 1114 98.7 F (37.1 C)     Temp Source 01/03/23 1114 Oral     SpO2 01/03/23 1114 99 %     Weight 01/03/23 1113 165 lb (74.8 kg)     Height 01/03/23 1113 5' (1.524 m)     Head Circumference --      Peak Flow --      Pain Score 01/03/23 1113 0     Pain Loc --      Pain Edu? --      Excl. in GC? --    No data found.  Updated Vital Signs BP 107/71 (BP Location: Left Arm)   Pulse 68   Temp 98.7 F (37.1 C) (Oral)   Ht 5' (1.524 m)   Wt 74.8 kg   LMP 12/02/2022   SpO2 99%   BMI 32.22 kg/m   Visual  Acuity Right Eye Distance:   Left Eye Distance:   Bilateral Distance:    Right Eye Near:   Left Eye Near:    Bilateral Near:     Physical Exam  GEN: alert, well appearing female, in no acute distress  EYES: extra occular movements intact, no scleral injection, no lid swelling HENT:  no uvular edema, no lesions, erythema or exudates, no tongue edema   CV: regular rate and rhythm RESP: no increased work of breathing, clear to ascultation bilaterally MSK: no extremity edema, normal ROM NEURO: alert, moves all extremities appropriately, normal gait PSYCH: Normal affect, appropriate speech and behavior  SKIN: diffuse erythematous patches and papules with warmth bilateral extremities, trunk, nape of neck, sparing palms and soles   UC Treatments / Results  Labs (all labs ordered are listed, but only abnormal results are displayed) Labs Reviewed - No data to display  EKG   Radiology No results found.  Procedures Procedures (including critical care time)  Medications Ordered in UC Medications - No data to display  Initial Impression / Assessment and Plan / UC Course  I have reviewed the triage vital signs and the nursing notes.  Pertinent labs & imaging results that were available during my care of the patient were reviewed by me and considered in my medical decision making (see chart for details).     Patient is a 28 y.o. female who presents for acute onset pruritic rash.  Overall, patient is well-appearing and well-hydrated.  Vital signs stable.  Tocara is afebrile.  DDx contact dermatitis, anaphylaxis, drug reaction, bug bites, scabies, hypersensitivity rash.  Recently completed a 7-day course of Macrobid for UTI.  She states that she has taken Macrobid before without issue.  Exam concerning for urticaria.  Offered IM decadron but pt declined. Considered anaphylaxis but pt has no other symptoms. VSS.  Treat with steroid taper, steroid ointment, Zrytec BID, Pepcid BID and Benadryl  PRN.  Reviewed expectations regarding course of current medical issues.  All questions asked were answered.  Outlined signs and symptoms indicating need for more acute intervention. Patient verbalized understanding.  After Visit Summary given.   Final Clinical Impressions(s) / UC Diagnoses   Final diagnoses:  Contact dermatitis, unspecified contact dermatitis type, unspecified trigger     Discharge Instructions      You have hives that are consistent with contact dermatitis. See handout for more information.    Take Pepcid/famotidine and Zyrtec 10 mg twice a day for the next 5-7 days.  Apply the steroid ointment as needed for itching.  You can take 25 mg of Benadryl every 4-6 hours as needed for itching.  You were also given a steroid tablet to help prevent the hives from returning.    ED Prescriptions     Medication Sig Dispense Auth. Provider   triamcinolone ointment (KENALOG) 0.1 % Apply 1 Application topically 4 (four) times daily as needed. 30 g Zayed Griffie, DO   famotidine (PEPCID) 20 MG tablet Take 1 tablet (20 mg total) by mouth 2 (two) times daily. 30 tablet Aadvika Konen, DO   cetirizine (ZYRTEC) 10 MG tablet Take 1 tablet (10 mg total) by mouth 2 (two) times daily as needed for allergies. 30 tablet Satish Hammers, DO   predniSONE (STERAPRED UNI-PAK 21 TAB) 10 MG (21) TBPK tablet Take by mouth daily. Take 6 tabs by mouth daily for 1, then 5 tabs for 1 day, then 4 tabs for 1 day, then 3 tabs for 1 day, then 2 tabs for 1 day, then 1 tab for 1 day. 21 tablet Lyndee Hensen, DO      PDMP not reviewed this encounter.              Lyndee Hensen, DO 01/03/23 1216

## 2023-01-03 NOTE — ED Triage Notes (Signed)
Pt c/o "red dots" all over body, pt states she noticed this yesterday , itching all over

## 2023-01-03 NOTE — Discharge Instructions (Addendum)
You have hives that are consistent with contact dermatitis. See handout for more information.    Take Pepcid/famotidine and Zyrtec 10 mg twice a day for the next 5-7 days.  Apply the steroid ointment as needed for itching.  You can take 25 mg of Benadryl every 4-6 hours as needed for itching.  You were also given a steroid tablet to help prevent the hives from returning.

## 2023-03-23 IMAGING — US US OB < 14 WEEKS - US OB TV
1 series · 14 of 28 positions shown · non-contrast
Comparison: None.

CLINICAL DATA: Miscarriage

EXAM:
OBSTETRIC <14 WK US AND TRANSVAGINAL OB US
TECHNIQUE: Both transabdominal and transvaginal ultrasound examinations were
performed for complete evaluation of the gestation as well as the
maternal uterus, adnexal regions, and pelvic cul-de-sac.
Transvaginal technique was performed to assess early pregnancy.

[Series 1: us ob less than 14 weeks with ob transvaginal · 84 acquisitions, 14 frames shown]
[im 4/84]
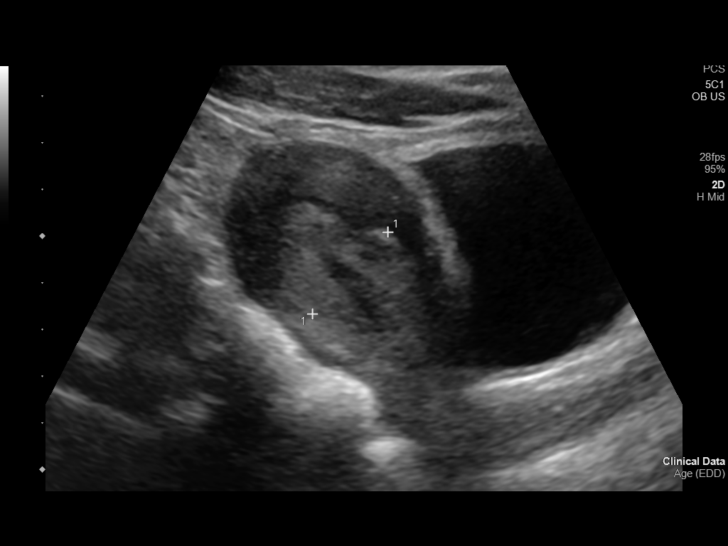
[im 10/84]
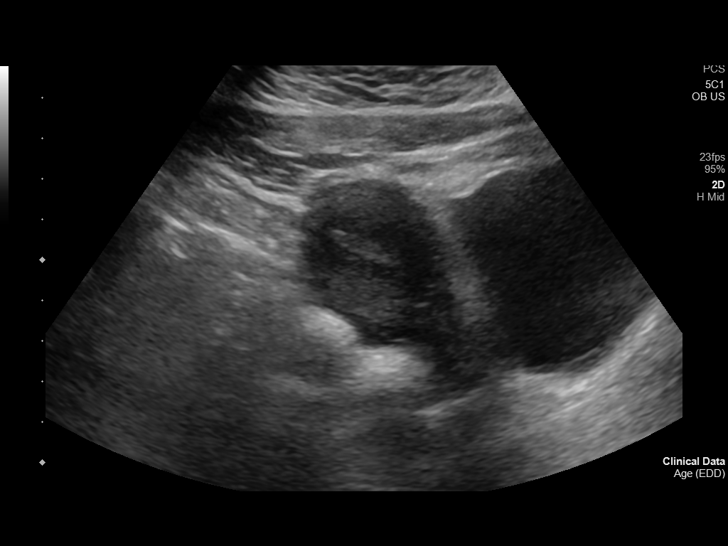
[im 16/84]
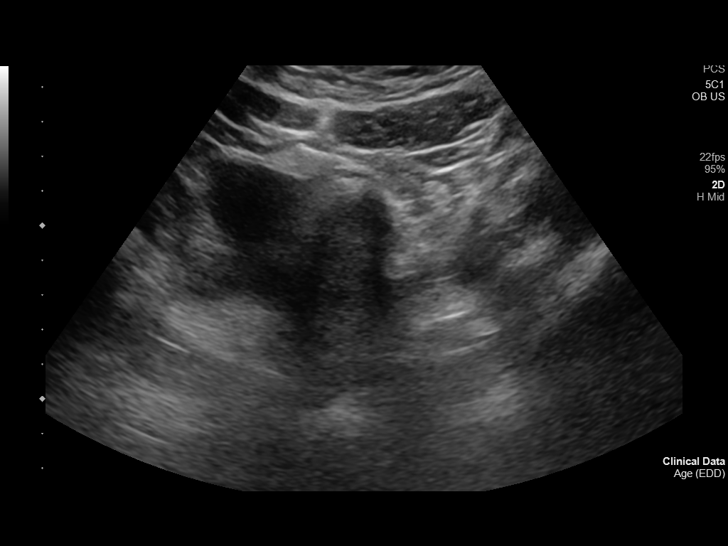
[im 22/84]
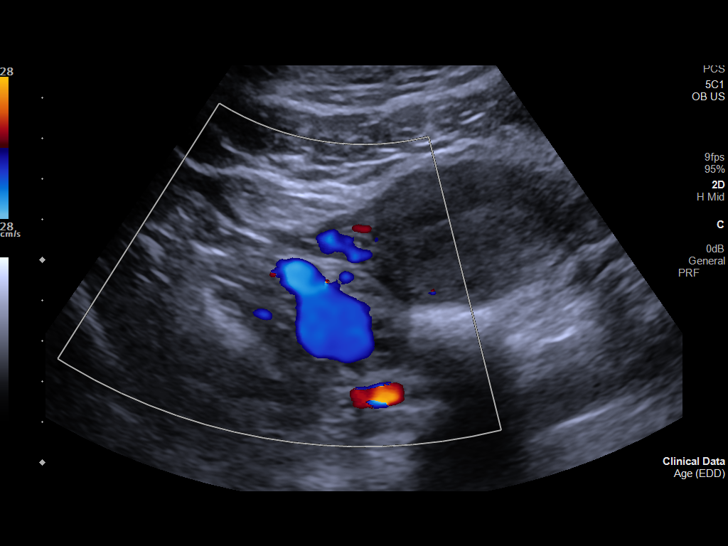
[im 28/84]
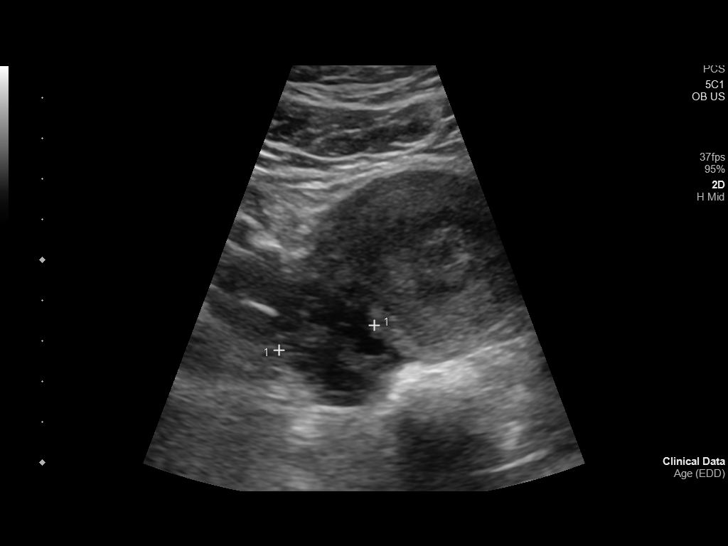
[im 34/84]
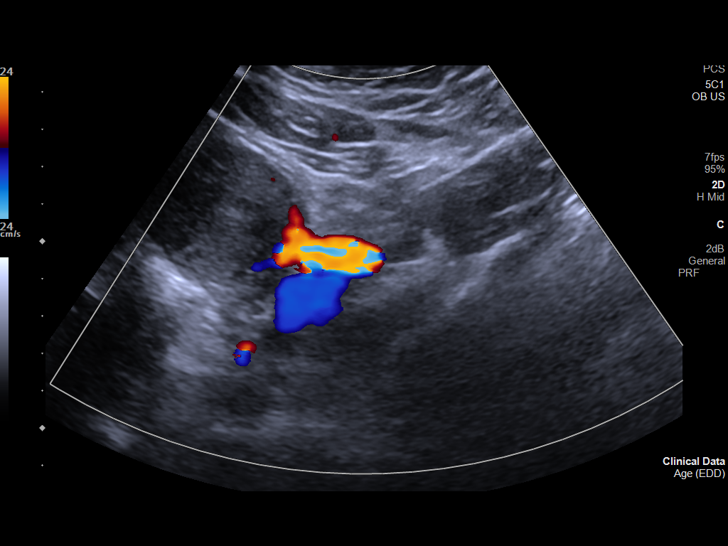
[im 40/84]
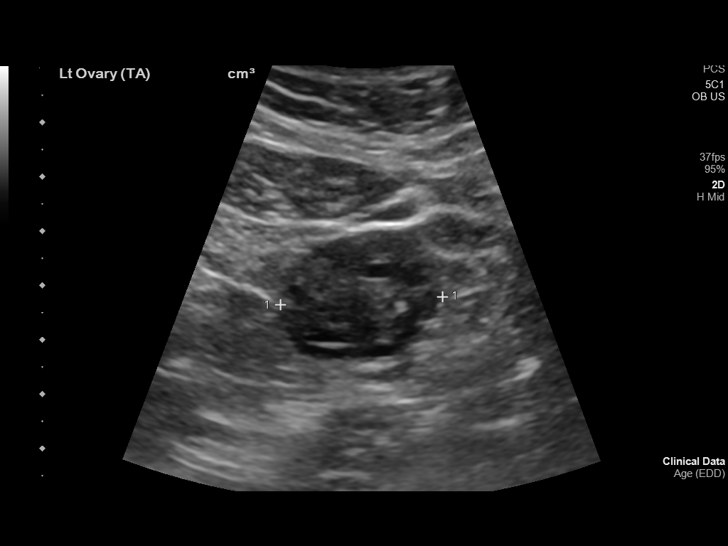
[im 47/84]
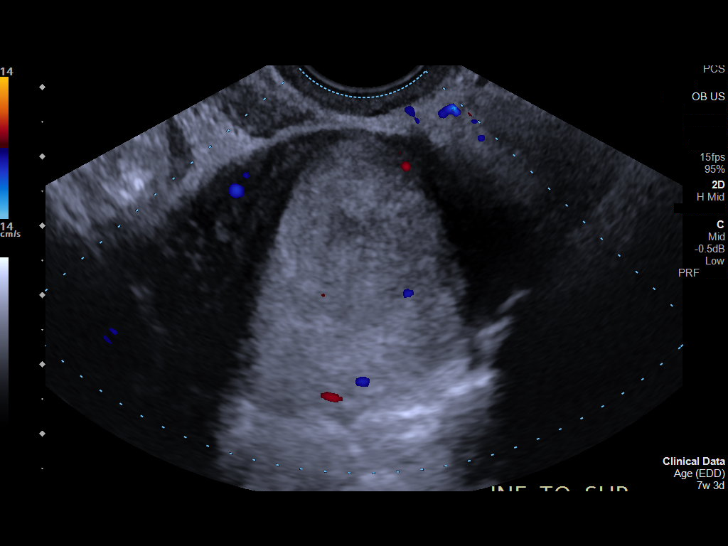
[im 53/84]
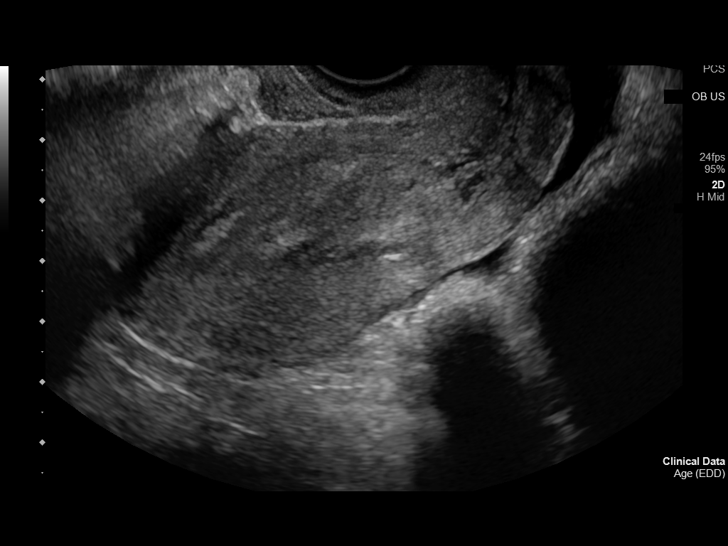
[im 59/84]
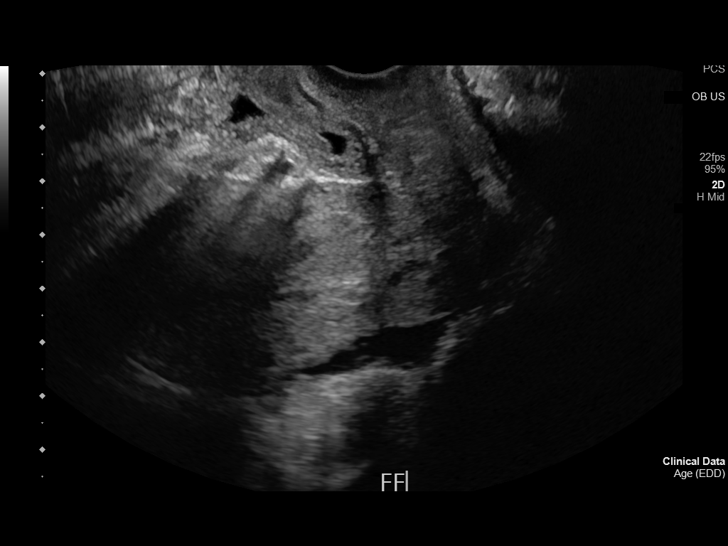
[im 65/84]
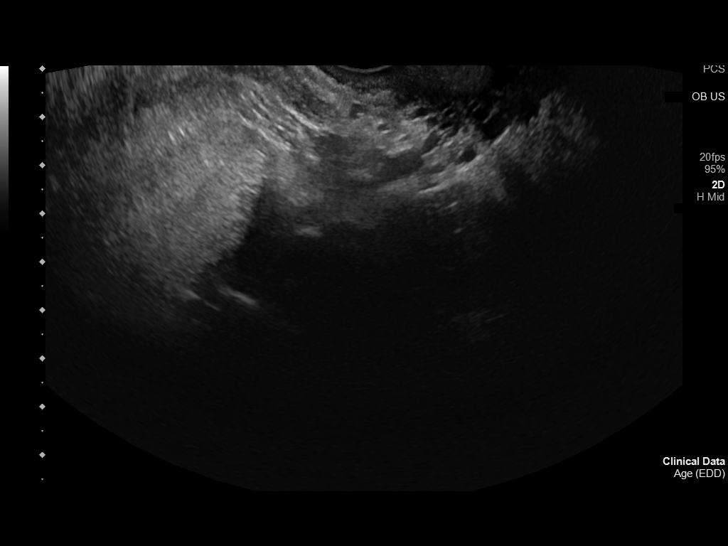
[im 71/84]
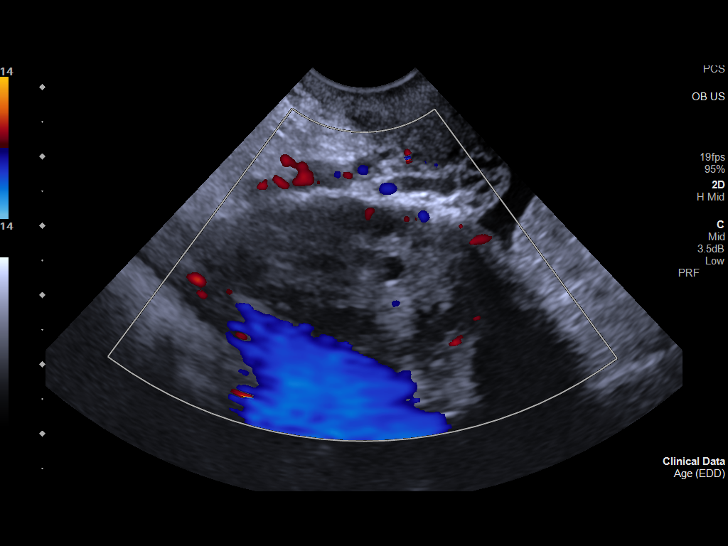
[im 77/84]
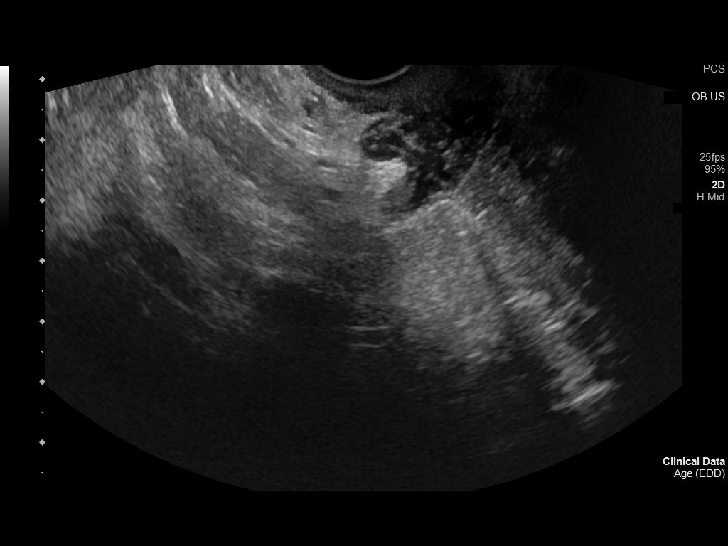
[im 84/84]
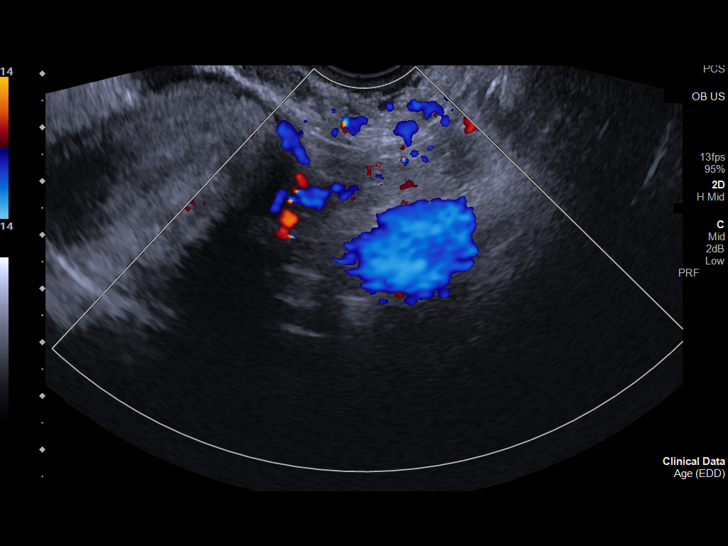

[14 of 28 positions shown; findings below may reference images not displayed]

FINDINGS: Intrauterine gestational sac: None

Yolk sac:  Not Visualized.

Subchorionic hemorrhage:  None visualized.

Maternal uterus/adnexae: Normal appearing ovaries. However within
the endometrium there is complex echogenic area with increased
vascular flow. Trace free fluid seen within the cul-de-sac.
IMPRESSION: Complex echogenic area within the endometrial canal. No intrauterine
gestational sac however seen. Close clinical correlation is
recommended with serial beta-hCG and followup ultrasound as
warranted.

## 2023-08-04 IMAGING — CT CT CTA ABD/PEL W/CM AND/OR W/O CM
3 of 10 series · 10 of 46 positions shown, 16 images · IV contrast (omnipaque)
Comparison: None.

CLINICAL DATA: 2 areas of prominent myometrial blood flow history
continued vaginal bleeding after miscarriage earlier this year

EXAM:
CTA ABDOMEN AND PELVIS WITHOUT AND WITH CONTRAST
TECHNIQUE: Multidetector CT imaging of the abdomen and pelvis was performed
using the standard protocol during bolus administration of
intravenous contrast. Multiplanar reconstructed images and MIPs were
obtained and reviewed to evaluate the vascular anatomy.
CONTRAST:  100mL OMNIPAQUE IOHEXOL 350 MG/ML SOLN

[Series 5: axial st cta arterial 2.00 · axial · arterial · 0.66mm/px · z∈[-1431,-1249]mm · 4 of 230 slices shown]
[im 16/230  soft-tissue]
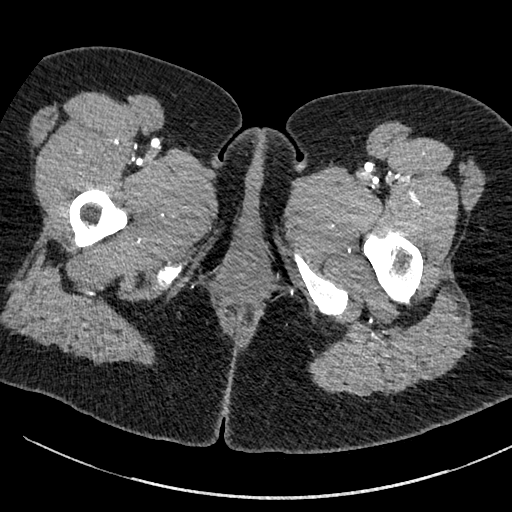
[im 46/230  soft-tissue]
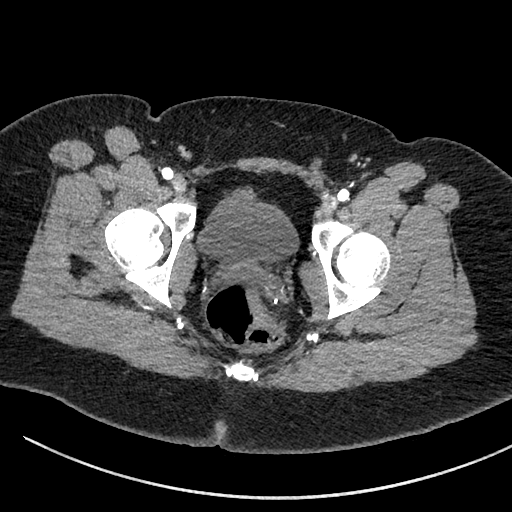
[im 77/230  soft-tissue]
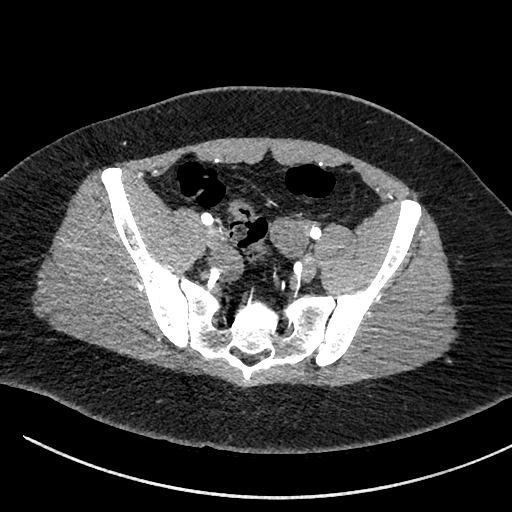
[im 107/230  soft-tissue]
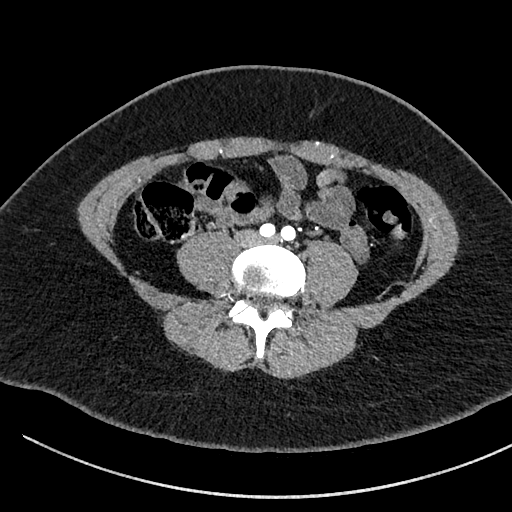

[Series 8: cor art st cta arterial 2.00 cor · coronal · arterial · 0.66mm/px · 2 of 136 slices shown, 3 images]
[im 46/136  soft-tissue]
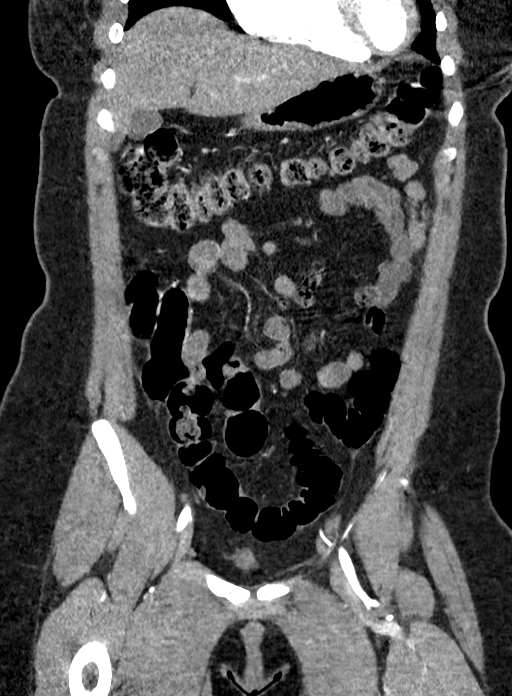
[im 46/136  bone]
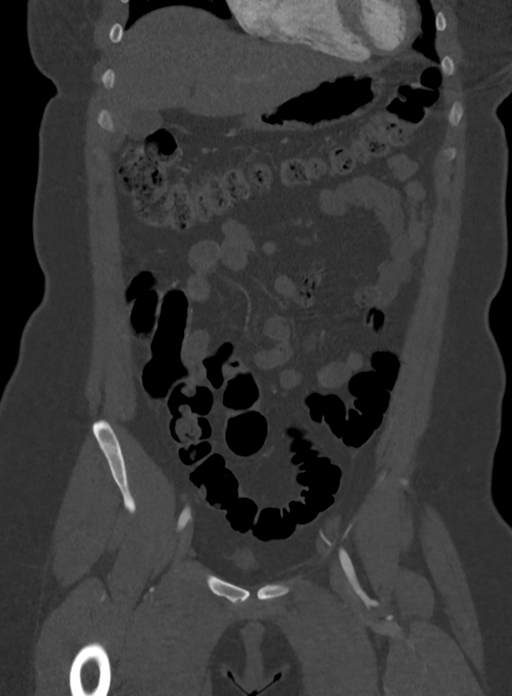
[im 91/136  soft-tissue]
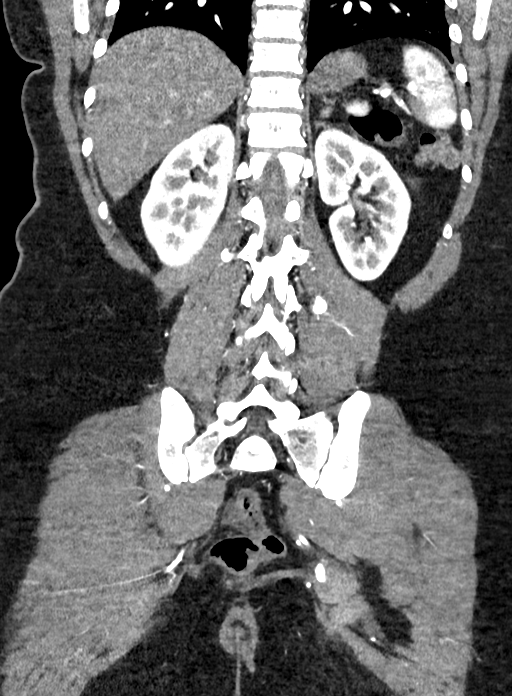

[Series 16: axial st cta venous 5.00 · axial · portal-venous · 0.66mm/px · z∈[-1370,-1100]mm · 4 of 92 slices shown, 9 images]
[im 19/92  soft-tissue]
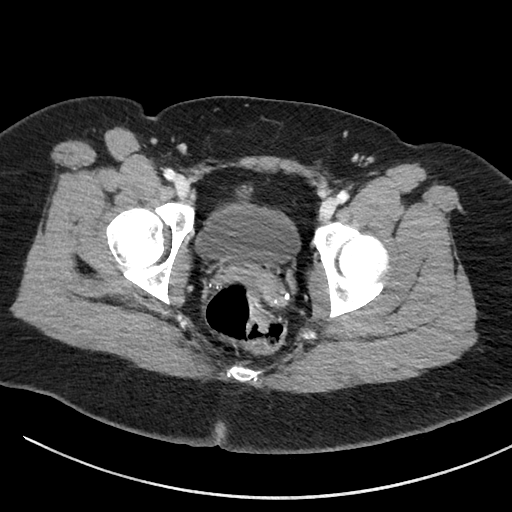
[im 19/92  lung]
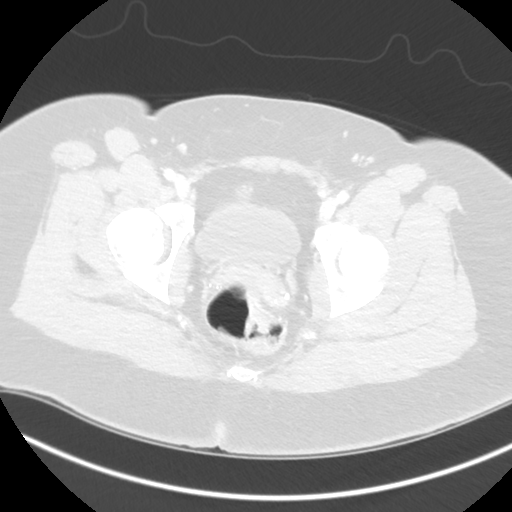
[im 19/92  bone]
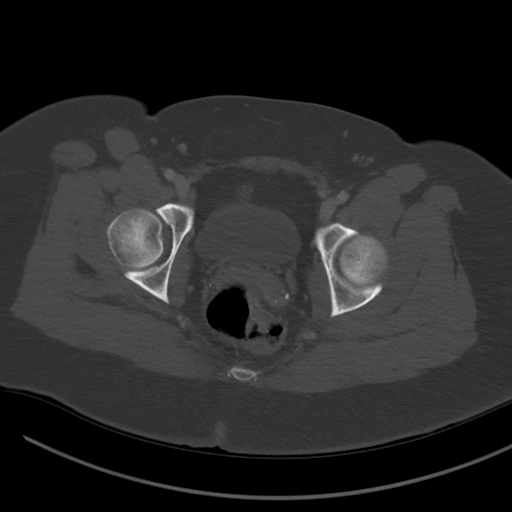
[im 37/92  soft-tissue]
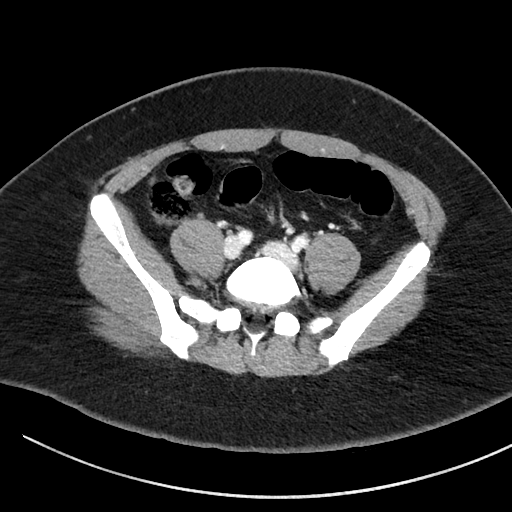
[im 37/92  lung]
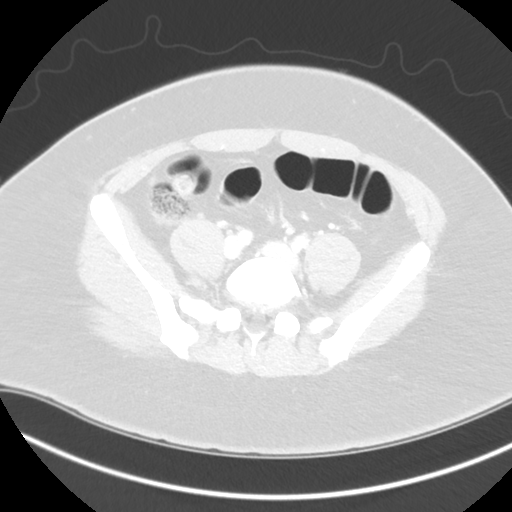
[im 55/92  soft-tissue]
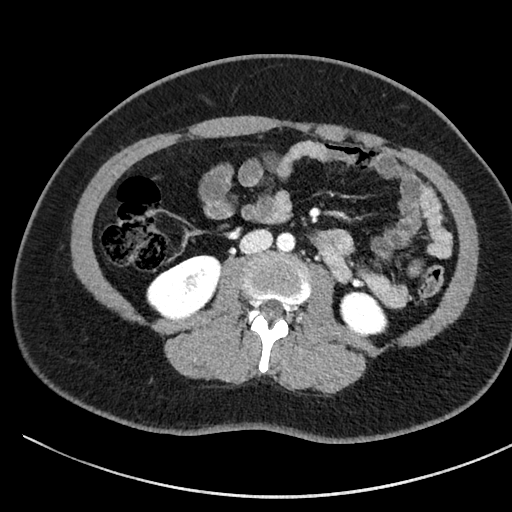
[im 55/92  lung]
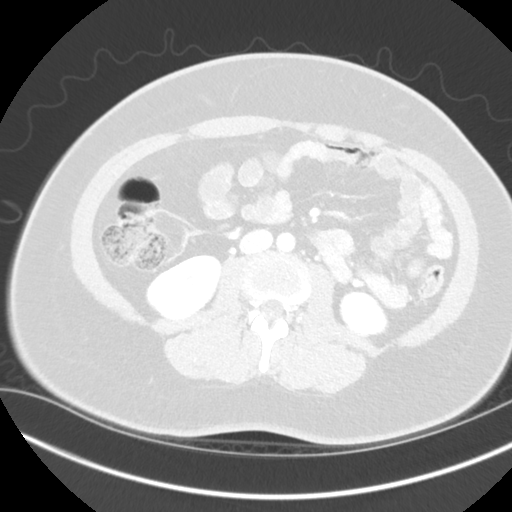
[im 73/92  soft-tissue]
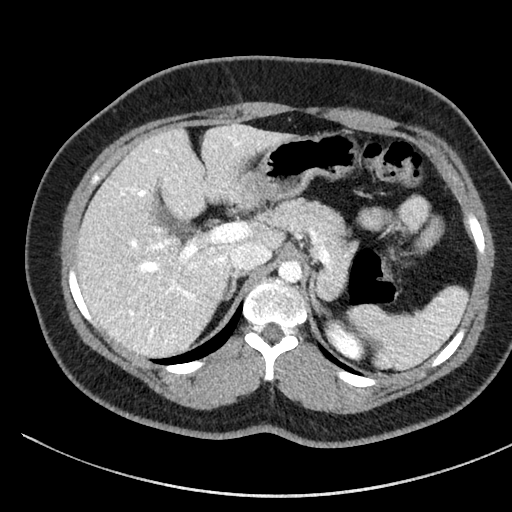
[im 73/92  lung]
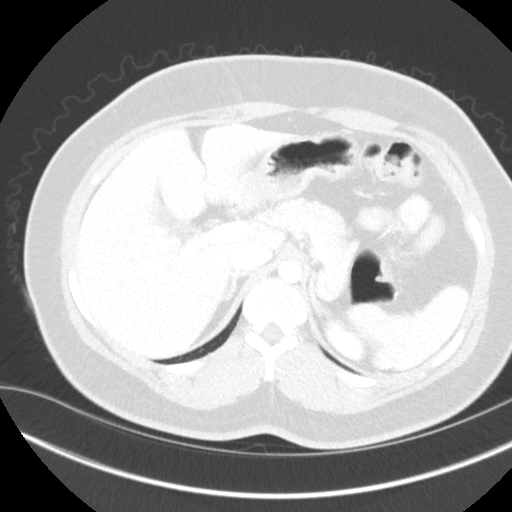

[10 of 46 positions shown; findings below may reference images not displayed]

FINDINGS: VASCULAR

Aorta: Normal caliber aorta without aneurysm, dissection, vasculitis
or significant stenosis.

Celiac: Patent without evidence of aneurysm, dissection, vasculitis
or significant stenosis.

SMA: Patent without evidence of aneurysm, dissection, vasculitis or
significant stenosis.

IMA: Patent.

Renals: Both renal arteries are patent without evidence of aneurysm,
dissection, vasculitis, fibromuscular dysplasia or significant
stenosis. Accessory right renal artery.

Uterine arteries: The bilateral uterine arteries are visualized
originating from the anterior division the internal iliac arteries.
There is uniform enhancement of the uterine body and fundus. There
is expected relative decreased enhancement of the cervix. No
findings of active arterial bleeding or pseudoaneurysm. No focal
vascular abnormality identified within the myometrium or
endometrium.

Inflow: Patent without evidence of aneurysm, dissection, vasculitis
or significant stenosis.

Veins: No obvious venous abnormality within the limitations of this
arterial phase study. The portal veins are patent.

NON-VASCULAR

Inferior chest: The lung bases are well-aerated.

Hepatobiliary: The liver is normal in size without focal
abnormality. No intrahepatic or extrahepatic biliary ductal
dilation. The gallbladder appears normal.

Spleen: Normal in size without focal abnormality.

Pancreas: No pancreatic ductal dilatation or surrounding
inflammatory changes.

Adrenals/Urinary Tract: Adrenal glands are unremarkable. Kidneys are
normal, without renal calculi, focal lesion, or hydronephrosis.
Bladder is unremarkable.

Stomach/Bowel: The stomach, small bowel and large bowel are normal
in caliber without abnormal wall thickening or surrounding
inflammatory changes.

Reproductive: Uniform enhancement of the uterus as described. No
focal vascular abnormality identified. No suspicious adnexal masses.

Lymphatic: No enlarged lymph nodes in the abdomen or pelvis.

Other: No abdominopelvic ascites.

Musculoskeletal: There is an osseous fragment from the L4 superior
endplate which demonstrates incomplete cortication. No significant
surrounding soft tissue swelling.
IMPRESSION: VASCULAR

1. There is normal expected enhancement of the uterus without focal
myometrial or endometrial vascular abnormality. No findings of
active bleeding or pseudoaneurysm.

NON-VASCULAR

1. There is an osseous fragment arising from the L4 vertebral body
superior endplate, which likely represents a limbus vertebra
(nontraumatic, benign chronic finding). However, the fragment is not
appear as well corticated as would be expected, and a
subacute/healing fracture is a differential consideration, although
less likely in the absence of surrounding soft tissue swelling and
symptoms. Recommend correlation with pain/tenderness on physical
exam.

These results will be called to the ordering clinician or
representative by the Radiologist Assistant, and communication
documented in the PACS or [REDACTED].
